# Patient Record
Sex: Male | Born: 1995 | Race: Black or African American | Hispanic: No | Marital: Single | Smoking: Never smoker
Health system: Southern US, Community
[De-identification: ages and names within clinical notes are randomized; demographics above are authoritative.]

## PROBLEM LIST (undated history)

## (undated) DIAGNOSIS — J45909 Unspecified asthma, uncomplicated: Secondary | ICD-10-CM

## (undated) DIAGNOSIS — G43909 Migraine, unspecified, not intractable, without status migrainosus: Secondary | ICD-10-CM

## (undated) HISTORY — PX: WISDOM TOOTH EXTRACTION: SHX21

---
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
## 2015-07-26 ENCOUNTER — Emergency Department (HOSPITAL_COMMUNITY)
Admission: EM | Admit: 2015-07-26 | Discharge: 2015-07-26 | Disposition: A | Discharge status: Home / Self Care | Payer: Medicaid Other | Attending: Emergency Medicine | Admitting: Emergency Medicine

## 2015-07-26 ENCOUNTER — Encounter (HOSPITAL_COMMUNITY): Payer: Self-pay | Admitting: *Deleted

## 2015-07-26 DIAGNOSIS — J309 Allergic rhinitis, unspecified: Secondary | ICD-10-CM | POA: Insufficient documentation

## 2015-07-26 DIAGNOSIS — J029 Acute pharyngitis, unspecified: Secondary | ICD-10-CM | POA: Insufficient documentation

## 2015-07-26 LAB — RAPID STREP SCREEN (MED CTR MEBANE ONLY): Streptococcus, Group A Screen (Direct): NEGATIVE

## 2015-07-26 MED ORDER — DEXAMETHASONE 2 MG PO TABS
10.0000 mg | ORAL_TABLET | Freq: Once | ORAL | Status: DC
  Filled 2015-07-26: qty 5

## 2015-07-26 MED ORDER — CETIRIZINE HCL 10 MG PO TABS: 10.0000 mg | ORAL_TABLET | Freq: Every day | ORAL | 1 refills | Status: AC

## 2015-07-26 MED ORDER — NAPROXEN 250 MG PO TABS: 250.0000 mg | ORAL_TABLET | Freq: Two times a day (BID) | ORAL | 0 refills | Status: AC

## 2015-07-26 MED ORDER — FLUTICASONE PROPIONATE 50 MCG/ACT NA SUSP: 2.0000 | Freq: Every day | NASAL | 0 refills | Status: AC

## 2015-07-26 MED ORDER — ACETAMINOPHEN 325 MG PO TABS
650.0000 mg | ORAL_TABLET | Freq: Once | ORAL | Status: AC
  Administered 2015-07-26: 650 mg via ORAL
  Filled 2015-07-26: qty 2

## 2015-07-26 MED ORDER — DEXAMETHASONE SODIUM PHOSPHATE 10 MG/ML IJ SOLN
10.0000 mg | Freq: Once | INTRAMUSCULAR | Status: AC
  Administered 2015-07-26: 10 mg via INTRAMUSCULAR
  Filled 2015-07-26: qty 1

## 2015-07-26 NOTE — ED Provider Notes (Signed)
[facility]-EMERGENCY DEPT Provider Note   CSN: [id number] Arrival date & time: 07/26/15  1317  First Provider Contact:  3:38 PM    By signing my name below, I, Essence Howell, attest that this documentation has been prepared under the direction and in the presence of Everlene Farrier, PA-C Electronically Signed: Charline Bills, ED Scribe 07/26/2015 at 3:48 PM.   History   Chief Complaint Chief Complaint  Patient presents with  . Sore Throat    HPI JOSPH RIMANDO is a 20 y.o. male.  HPI Comments: VIBHAV CUBIAS is a 20 y.o. male who presents to the Emergency Department complaining of ongoing sore throat for the past 2 weeks. Pt reports increased pain with swallowing. He reports associated pressure behind both ears. No treatments tried PTA. He denies fever, sneezing, trouble swallowing, nasal congestion, cough, neck pain, SOB, abdominal pain, nausea, vomiting, generalized body aches.    The history is provided by the patient. No language interpreter was used.    Past Medical History:  Diagnosis Date  . Asthma   . Migraine     There are no active problems to display for this patient.   History reviewed. No pertinent surgical history.   Home Medications    Prior to Admission medications   Medication Sig Start Date End Date Taking? Authorizing Provider  cetirizine (ZYRTEC ALLERGY) 10 MG tablet Take 1 tablet (10 mg total) by mouth daily. 07/26/15   Everlene Farrier, PA-C  fluticasone (FLONASE) 50 MCG/ACT nasal spray Place 2 sprays into both nostrils daily. 07/26/15   Everlene Farrier, PA-C  HYDROcodone-acetaminophen (NORCO/VICODIN) 5-325 MG per tablet Take 1 tablet by mouth every 4 (four) hours as needed for pain. 03/09/12   Roma Kayser Schorr, NP  ibuprofen (ADVIL,MOTRIN) 800 MG tablet Take 800 mg by mouth every 8 (eight) hours as needed for pain. Reports taking 2 -800mg  ibuprofen an hour apart of each other.    Historical Provider, MD  naproxen (NAPROSYN) 250 MG tablet Take 1  tablet (250 mg total) by mouth 2 (two) times daily with a meal. 07/26/15   Everlene Farrier, PA-C  topiramate (TOPAMAX) 25 MG capsule Take 25 mg by mouth 2 (two) times daily.    Historical Provider, MD    Family History History reviewed. No pertinent family history.  Social History Social History  Substance Use Topics  . Smoking status: Never Smoker  . Smokeless tobacco: Not on file  . Alcohol use No    Allergies   Review of patient's allergies indicates no known allergies.   Review of Systems Review of Systems  Constitutional: Negative for chills, fatigue and fever.  HENT: Positive for congestion and sore throat. Negative for dental problem, drooling, ear discharge, facial swelling, hearing loss, mouth sores, nosebleeds, postnasal drip, sneezing, trouble swallowing and voice change.   Eyes: Negative for pain and visual disturbance.  Respiratory: Negative for cough, shortness of breath and wheezing.   Gastrointestinal: Negative for abdominal pain, nausea and vomiting.  Musculoskeletal: Negative for myalgias and neck pain.  Skin: Negative for rash.    Physical Exam Updated Vital Signs BP 125/63 (BP Location: Right Arm)   Pulse 111   Temp 98.2 F (36.8 C) (Oral)   Resp 16   Ht 6' (1.829 m)   Wt 83.9 kg   SpO2 97%   BMI 25.09 kg/m   Physical Exam  Constitutional: He is oriented to person, place, and time. He appears well-developed and well-nourished. No distress.  Nontoxic appearing.  HENT:  Head:  Normocephalic and atraumatic.  Right Ear: External ear normal.  Left Ear: External ear normal.  Mouth/Throat: Uvula is midline. No trismus in the jaw. No oropharyngeal exudate.  Cerumen in both TMs No ear discharge  Moderate bilateral tonsillar hypertrophy without exudates. Uvula midline without edema. Tongue protrusion is normal. No trismus. No peritonsillar abscess. No drooling.  Speech clear and coherent Boggy nasal turbinates bilaterally.   Eyes: Conjunctivae are  normal. Pupils are equal, round, and reactive to light. Right eye exhibits no discharge. Left eye exhibits no discharge.  Neck: Normal range of motion. Neck supple. No JVD present.  Cardiovascular: Normal rate, regular rhythm, normal heart sounds and intact distal pulses.   HR is 76  Pulmonary/Chest: Effort normal and breath sounds normal. No stridor. No respiratory distress. He has no wheezes. He has no rales.  Lungs clear to auscultation bilaterally.  Abdominal: Soft. There is no tenderness.  Lymphadenopathy:    He has no cervical adenopathy.  Neurological: He is alert and oriented to person, place, and time. Coordination normal.  Skin: Skin is warm and dry. Capillary refill takes less than 2 seconds. No rash noted. He is not diaphoretic. No erythema. No pallor.  Psychiatric: He has a normal mood and affect. His behavior is normal.  Nursing note and vitals reviewed.    ED Treatments / Results  Labs (all labs ordered are listed, but only abnormal results are displayed) Labs Reviewed  RAPID STREP SCREEN (NOT AT [facility])  CULTURE, GROUP A STREP [facility])    EKG  EKG Interpretation None       Radiology No results found.  Procedures Procedures (including critical care time) DIAGNOSTIC STUDIES: Oxygen Saturation is 97% on RA, normal by my interpretation.    COORDINATION OF CARE: 3:42 PM-Discussed treatment plan which includes strep screen, Decadron injection and Tylenol with pt at bedside and pt agreed to plan.   Medications Ordered in ED Medications  dexamethasone (DECADRON) 10 MG/ML injection for Pediatric ORAL use 10 mg (not administered)  acetaminophen (TYLENOL) tablet 650 mg (not administered)    Initial Impression / Assessment and Plan / ED Course  I have reviewed the triage vital signs and the nursing notes.  Pertinent labs & imaging results that were available during my care of the patient were reviewed by me and considered in my medical decision making (see chart for  details).  Clinical Course   This  is a 20 y.o. male who presents to the Emergency Department complaining of ongoing sore throat for the past 2 weeks. Pt reports increased pain with swallowing. He reports associated pressure behind both ears. No treatments tried PTA. He denies fever, sneezing, trouble swallowing, nasal congestion, cough, neck pain, SOB, abdominal pain, nausea, vomiting, generalized body aches.  On exam the patient is afebrile nontoxic appearing. He has mild to moderate bilateral tonsillar hypertrophy. No exudates. Uvula is Without edema. No peritonsillar abscess. No trismus. No drooling. Temperatures and is normal. Speech is clear and coherent. Boggy nasal turbinates bilaterally. Rapid strep is negative. I question if this is somewhat related to allergy symptoms. Patient is provided with oral Decadron and Tylenol. Encouraged him to use naproxen as well as start Zyrtec and Flonase at home. I advised that if his symptoms persist to please follow-up with ENT doctor [facility]. I advised the patient to follow-up with their primary care provider this week. I advised the patient to return to the emergency department with new or worsening symptoms or new concerns. The patient verbalized understanding and agreement  with plan.    I personally performed the services described in this documentation, which was scribed in my presence. The recorded information has been reviewed and is accurate.     Final Clinical Impressions(s) / ED Diagnoses   Final diagnoses:  Sore throat  Allergic rhinitis, unspecified allergic rhinitis type    New Prescriptions New Prescriptions   CETIRIZINE (ZYRTEC ALLERGY) 10 MG TABLET    Take 1 tablet (10 mg total) by mouth daily.   FLUTICASONE (FLONASE) 50 MCG/ACT NASAL SPRAY    Place 2 sprays into both nostrils daily.   NAPROXEN (NAPROSYN) 250 MG TABLET    Take 1 tablet (250 mg total) by mouth 2 (two) times daily with a meal.     Everlene Farrier, PA-C 07/26/15  1556    Laurence Spates, MD 08/06/15 1745

## 2015-07-26 NOTE — ED Triage Notes (Signed)
Pt reports a 2 week HX of sore throat  And a productive cough.

## 2015-07-29 LAB — CULTURE, GROUP A STREP (THRC)

## 2016-02-25 ENCOUNTER — Ambulatory Visit (HOSPITAL_COMMUNITY)
Admission: EM | Admit: 2016-02-25 | Discharge: 2016-02-25 | Disposition: A | Discharge status: Home / Self Care | Payer: BLUE CROSS/BLUE SHIELD | Attending: Family Medicine | Admitting: Family Medicine

## 2016-02-25 ENCOUNTER — Encounter (HOSPITAL_COMMUNITY): Payer: Self-pay | Admitting: Emergency Medicine

## 2016-02-25 DIAGNOSIS — J039 Acute tonsillitis, unspecified: Secondary | ICD-10-CM

## 2016-02-25 MED ORDER — AMOXICILLIN 500 MG PO CAPS: 500.0000 mg | ORAL_CAPSULE | Freq: Three times a day (TID) | ORAL | 0 refills | Status: DC

## 2016-02-25 NOTE — ED Triage Notes (Signed)
Pt started suffering from a sore throat Wednesday night.  Since then he has had N&V, body aches, headache and a fever of 102.

## 2016-02-25 NOTE — ED Provider Notes (Signed)
CSN: [id number]     Arrival date & time 02/25/16  1719 History   None    Chief Complaint  Patient presents with  . Sore Throat   (Consider location/radiation/quality/duration/timing/severity/associated sxs/prior Treatment) The history is provided by the patient. No language interpreter was used.  Sore Throat  This is a new problem. The current episode started more than 2 days ago. The problem occurs constantly. The problem has been gradually worsening. Pertinent negatives include no headaches. Nothing aggravates the symptoms. Nothing relieves the symptoms. He has tried nothing for the symptoms. The treatment provided no relief.    Past Medical History:  Diagnosis Date  . Asthma   . Migraine    History reviewed. No pertinent surgical history. History reviewed. No pertinent family history. Social History  Substance Use Topics  . Smoking status: Never Smoker  . Smokeless tobacco: Never Used  . Alcohol use No    Review of Systems  Neurological: Negative for headaches.  All other systems reviewed and are negative.   Allergies  Patient has no known allergies.  Home Medications   Prior to Admission medications   Medication Sig Start Date End Date Taking? Authorizing Provider  amoxicillin (AMOXIL) 500 MG capsule Take 1 capsule (500 mg total) by mouth 3 (three) times daily. 02/25/16   Elson AreasLeslie K Sofia, PA-C  cetirizine (ZYRTEC ALLERGY) 10 MG tablet Take 1 tablet (10 mg total) by mouth daily. 07/26/15   Everlene FarrierWilliam Dansie, PA-C  fluticasone (FLONASE) 50 MCG/ACT nasal spray Place 2 sprays into both nostrils daily. 07/26/15   Everlene FarrierWilliam Dansie, PA-C  HYDROcodone-acetaminophen (NORCO/VICODIN) 5-325 MG per tablet Take 1 tablet by mouth every 4 (four) hours as needed for pain. 03/09/12   Roma KayserKatherine P Schorr, NP  ibuprofen (ADVIL,MOTRIN) 800 MG tablet Take 800 mg by mouth every 8 (eight) hours as needed for pain. Reports taking 2 -800mg  ibuprofen an hour apart of each other.    Historical Provider, MD   naproxen (NAPROSYN) 250 MG tablet Take 1 tablet (250 mg total) by mouth 2 (two) times daily with a meal. 07/26/15   Everlene FarrierWilliam Dansie, PA-C  topiramate (TOPAMAX) 25 MG capsule Take 25 mg by mouth 2 (two) times daily.    Historical Provider, MD   Meds Ordered and Administered this Visit  Medications - No data to display  BP 120/68 (BP Location: Left Arm)   Pulse 100   Temp 99.8 F (37.7 C) (Oral)   SpO2 100%  No data found.   Physical Exam  Constitutional: He appears well-developed and well-nourished.  HENT:  Mouth/Throat: Oropharyngeal exudate present.  Eyes: Conjunctivae are normal. Pupils are equal, round, and reactive to light.  Neck: Normal range of motion.  Cardiovascular: Normal rate.   Pulmonary/Chest: Effort normal.  Musculoskeletal: Normal range of motion.  Neurological: He is alert.  Skin: Skin is warm.  Psychiatric: He has a normal mood and affect.  Nursing note and vitals reviewed.   Urgent Care Course     Procedures (including critical care time)  Labs Review Labs Reviewed - No data to display  Imaging Review No results found.   Visual Acuity Review  Right Eye Distance:   Left Eye Distance:   Bilateral Distance:    Right Eye Near:   Left Eye Near:    Bilateral Near:         MDM Pt has large swollen tonsils, exudate.  I will treat with amoxicillian, pt advised to return if any problems.   1. Tonsillitis  An After Visit Summary was printed and given to the patient. Meds ordered this encounter  Medications  . amoxicillin (AMOXIL) 500 MG capsule    Sig: Take 1 capsule (500 mg total) by mouth 3 (three) times daily.    Dispense:  30 capsule    Refill:  0    Order Specific Question:   Supervising Provider    Answer:   Bradd Canary D [[id number]]     Lonia Skinner Letcher, PA-C 02/25/16 [phone]

## 2016-02-25 NOTE — Discharge Instructions (Signed)
Return if any problems.

## 2016-02-28 ENCOUNTER — Ambulatory Visit (HOSPITAL_COMMUNITY)
Admission: EM | Admit: 2016-02-28 | Discharge: 2016-02-28 | Disposition: A | Discharge status: Home / Self Care | Payer: BLUE CROSS/BLUE SHIELD | Attending: Family Medicine | Admitting: Family Medicine

## 2016-02-28 ENCOUNTER — Encounter (HOSPITAL_COMMUNITY): Payer: Self-pay | Admitting: Emergency Medicine

## 2016-02-28 DIAGNOSIS — J039 Acute tonsillitis, unspecified: Secondary | ICD-10-CM | POA: Diagnosis not present

## 2016-02-28 DIAGNOSIS — B2790 Infectious mononucleosis, unspecified without complication: Secondary | ICD-10-CM | POA: Diagnosis not present

## 2016-02-28 LAB — POCT INFECTIOUS MONO SCREEN: Mono Screen: POSITIVE — AB

## 2016-02-28 MED ORDER — DEXAMETHASONE SODIUM PHOSPHATE 10 MG/ML IJ SOLN
INTRAMUSCULAR | Status: AC
  Filled 2016-02-28: qty 1

## 2016-02-28 MED ORDER — ONDANSETRON 4 MG PO TBDP
ORAL_TABLET | ORAL | Status: AC
  Filled 2016-02-28: qty 1

## 2016-02-28 MED ORDER — ONDANSETRON HCL 4 MG PO TABS
4.0000 mg | ORAL_TABLET | Freq: Four times a day (QID) | ORAL | 0 refills | Status: AC
Start: 2016-02-28 — End: ?

## 2016-02-28 MED ORDER — ACETAMINOPHEN 325 MG PO TABS
ORAL_TABLET | ORAL | Status: AC
  Filled 2016-02-28: qty 2

## 2016-02-28 MED ORDER — ACETAMINOPHEN 325 MG PO TABS
650.0000 mg | ORAL_TABLET | Freq: Once | ORAL | Status: AC
  Administered 2016-02-28: 650 mg via ORAL

## 2016-02-28 MED ORDER — ONDANSETRON 4 MG PO TBDP
4.0000 mg | ORAL_TABLET | Freq: Once | ORAL | Status: AC
  Administered 2016-02-28: 4 mg via ORAL

## 2016-02-28 MED ORDER — DEXAMETHASONE SODIUM PHOSPHATE 10 MG/ML IJ SOLN
10.0000 mg | Freq: Once | INTRAMUSCULAR | Status: AC
  Administered 2016-02-28: 10 mg via INTRAMUSCULAR

## 2016-02-28 NOTE — Discharge Instructions (Addendum)
You may stop taking the amoxicillin. Take ibuprofen every 6-8 hours as needed and may also take Tylenol every 4 hours. Be sure to drink plenty of fluids and stay well-hydrated. You may feel weak and fatigued for a couple of weeks or longer. Your sore throat should be getting better this week. Cepacol lozenges to help with pain. Read your instructions regarding mononucleosis. No physical activity for 3 weeks. No contact sports for 4 weeks. If you develop abdominal pain or tenderness seek medical attention promptly.

## 2016-02-28 NOTE — ED Provider Notes (Signed)
CSN: [id number]     Arrival date & time 02/28/16  1447 History   First MD Initiated Contact with Patient 02/28/16 1559     Chief Complaint  Patient presents with  . Fever  . Nausea   (Consider location/radiation/quality/duration/timing/severity/associated sxs/prior Treatment) 21 year old male presents with tonsillitis. He was here reported 23rd, 3 days ago and was diagnosed with exudative tonsillitis and treated with amoxicillin 3 times a day advised to take ibuprofen and Tylenol. He has been taken ibuprofen but not Tylenol in the last dose was 10 AM. He states he came back because the fever is not any better , it is remaining high and his throat still hurts. He is also had nausea and some vomiting. He has vomited once today. He is able to drink water but not able to eat. No trouble with breathing. Compliant with antibiotics.      Past Medical History:  Diagnosis Date  . Asthma   . Migraine    History reviewed. No pertinent surgical history. History reviewed. No pertinent family history. Social History  Substance Use Topics  . Smoking status: Never Smoker  . Smokeless tobacco: Never Used  . Alcohol use No    Review of Systems  Constitutional: Positive for activity change, appetite change, fatigue and fever.  HENT: Positive for sore throat. Negative for congestion and ear pain.   Eyes: Negative.   Respiratory: Negative.   Cardiovascular: Negative.   Gastrointestinal: Positive for nausea and vomiting. Negative for abdominal pain.  Genitourinary: Negative.   Musculoskeletal: Negative.   Neurological: Positive for headaches.  All other systems reviewed and are negative.   Allergies  Patient has no known allergies.  Home Medications   Prior to Admission medications   Medication Sig Start Date End Date Taking? Authorizing Provider  cetirizine (ZYRTEC ALLERGY) 10 MG tablet Take 1 tablet (10 mg total) by mouth daily. 07/26/15   Everlene FarrierWilliam Dansie, PA-C  fluticasone (FLONASE) 50  MCG/ACT nasal spray Place 2 sprays into both nostrils daily. 07/26/15   Everlene FarrierWilliam Dansie, PA-C  HYDROcodone-acetaminophen (NORCO/VICODIN) 5-325 MG per tablet Take 1 tablet by mouth every 4 (four) hours as needed for pain. 03/09/12   Roma KayserKatherine P Schorr, NP  ibuprofen (ADVIL,MOTRIN) 800 MG tablet Take 800 mg by mouth every 8 (eight) hours as needed for pain. Reports taking 2 -800mg  ibuprofen an hour apart of each other.    Historical Provider, MD  naproxen (NAPROSYN) 250 MG tablet Take 1 tablet (250 mg total) by mouth 2 (two) times daily with a meal. 07/26/15   Everlene FarrierWilliam Dansie, PA-C  ondansetron (ZOFRAN) 4 MG tablet Take 1 tablet (4 mg total) by mouth every 6 (six) hours. 02/28/16   Hayden Rasmussenavid Alaria Oconnor, NP  topiramate (TOPAMAX) 25 MG capsule Take 25 mg by mouth 2 (two) times daily.    Historical Provider, MD   Meds Ordered and Administered this Visit   Medications  acetaminophen (TYLENOL) tablet 650 mg (650 mg Oral Given 02/28/16 1544)  dexamethasone (DECADRON) injection 10 mg (10 mg Intramuscular Given 02/28/16 1618)  ondansetron (ZOFRAN-ODT) disintegrating tablet 4 mg (4 mg Oral Given 02/28/16 1617)    BP 111/74 (BP Location: Right Arm)   Pulse 111   Temp 102.9 F (39.4 C) (Oral)   SpO2 100%  No data found.   Physical Exam  Constitutional: He is oriented to person, place, and time. He appears well-developed and well-nourished. No distress.  HENT:  Head: Normocephalic and atraumatic.  Right Ear: External ear normal.  Left Ear: External ear  normal.  Mouth/Throat: Oropharyngeal exudate present.  Bilateral TMs are normal. Oropharynx with bilateral enlarged erythematous muscles covered with gray exudates. Posterior pharynx with erythema. Airway widely patent. No evidence of abscess formation.  Eyes: EOM are normal.  Neck: Normal range of motion. Neck supple.  Solitary left anterior cervical node. Posterior cervical nodes palpated.  Cardiovascular: Normal rate, regular rhythm, normal heart sounds and intact  distal pulses.   Pulmonary/Chest: Effort normal and breath sounds normal. No respiratory distress. He has no wheezes.  Musculoskeletal: Normal range of motion. He exhibits no edema.  Neurological: He is alert and oriented to person, place, and time.  Skin: Skin is warm and dry.  Psychiatric: He has a normal mood and affect.  Nursing note and vitals reviewed.   Urgent Care Course     Procedures (including critical care time)  Labs Review Labs Reviewed  POCT INFECTIOUS MONO SCREEN - Abnormal; Notable for the following:       Result Value   Mono Screen POSITIVE (*)    All other components within normal limits    Imaging Review No results found.   Visual Acuity Review  Right Eye Distance:   Left Eye Distance:   Bilateral Distance:    Right Eye Near:   Left Eye Near:    Bilateral Near:         MDM   1. Mononucleosis   2. Exudative tonsillitis    You may stop taking the amoxicillin. Take ibuprofen every 6-8 hours as needed and may also take Tylenol every 4 hours. Be sure to drink plenty of fluids and stay well-hydrated. You may feel weak and fatigued for a couple of weeks or longer. Your sore throat should be getting better this week. Cepacol lozenges to help with pain. Read your instructions regarding mononucleosis. No physical activity for 3 weeks. No contact sports for 4 weeks. If you develop abdominal pain or tenderness seek medical attention promptly. Meds ordered this encounter  Medications  . acetaminophen (TYLENOL) tablet 650 mg  . dexamethasone (DECADRON) injection 10 mg  . ondansetron (ZOFRAN-ODT) disintegrating tablet 4 mg  . ondansetron (ZOFRAN) 4 MG tablet    Sig: Take 1 tablet (4 mg total) by mouth every 6 (six) hours.    Dispense:  12 tablet    Refill:  0    Order Specific Question:   Supervising Provider    Answer:   Linna Hoff [[id number]]       Hayden Rasmussen, NP 02/28/16 1704

## 2016-02-28 NOTE — ED Triage Notes (Signed)
Pt was seen here and treated two days ago for tonsillitis.  Pt has been taking his Amoxicillin as prescribed.  Pt is here today for increasing nausea and fever.  Pt presents today febrile 102.9.

## 2017-12-15 ENCOUNTER — Encounter (HOSPITAL_COMMUNITY): Payer: Self-pay

## 2017-12-15 ENCOUNTER — Emergency Department (HOSPITAL_COMMUNITY)
Admission: EM | Admit: 2017-12-15 | Discharge: 2017-12-15 | Disposition: A | Discharge status: Home / Self Care | Payer: Managed Care, Other (non HMO) | Attending: Emergency Medicine | Admitting: Emergency Medicine

## 2017-12-15 ENCOUNTER — Other Ambulatory Visit: Payer: Self-pay

## 2017-12-15 DIAGNOSIS — J45909 Unspecified asthma, uncomplicated: Secondary | ICD-10-CM | POA: Insufficient documentation

## 2017-12-15 DIAGNOSIS — S0993XA Unspecified injury of face, initial encounter: Secondary | ICD-10-CM | POA: Diagnosis present

## 2017-12-15 DIAGNOSIS — S0181XA Laceration without foreign body of other part of head, initial encounter: Secondary | ICD-10-CM

## 2017-12-15 DIAGNOSIS — Z79899 Other long term (current) drug therapy: Secondary | ICD-10-CM | POA: Diagnosis not present

## 2017-12-15 DIAGNOSIS — Y999 Unspecified external cause status: Secondary | ICD-10-CM | POA: Insufficient documentation

## 2017-12-15 DIAGNOSIS — Y9367 Activity, basketball: Secondary | ICD-10-CM | POA: Diagnosis not present

## 2017-12-15 DIAGNOSIS — W51XXXA Accidental striking against or bumped into by another person, initial encounter: Secondary | ICD-10-CM | POA: Diagnosis not present

## 2017-12-15 DIAGNOSIS — Y929 Unspecified place or not applicable: Secondary | ICD-10-CM | POA: Diagnosis not present

## 2017-12-15 MED ORDER — LIDOCAINE HCL 1 % IJ SOLN
INTRAMUSCULAR | Status: AC
  Administered 2017-12-15: 20 mL
  Filled 2017-12-15: qty 20

## 2017-12-15 NOTE — ED Triage Notes (Signed)
patient reports that he was playing basketball and another player fell on him and he hit his chin on the floor which caused the laceration. Bleeding controlled.

## 2017-12-15 NOTE — Discharge Instructions (Addendum)
Your sutures will need to be taken out in 5 to 7 days. You can go to your doctor, the Urgent Care or return here. Return sooner for any problems.

## 2017-12-15 NOTE — ED Provider Notes (Signed)
[facility]-EMERGENCY DEPT Provider Note   CSN: [id number] Arrival date & time: 12/15/17  1755     History   Chief Complaint Chief Complaint  Patient presents with  . chin laceration    HPI Jacob Carey is a 22 y.o. male who presents to the ED for a laceration to the chin. Patient reports he was playing basketball and there was a loose ball. Patient went for the ball and fell and another player fell on the patient. Patient's chin hit and cut it. Up to date on immunizations.   HPI  Past Medical History:  Diagnosis Date  . Asthma   . Migraine     There are no active problems to display for this patient.   Past Surgical History:  Procedure Laterality Date  . WISDOM TOOTH EXTRACTION          Home Medications    Prior to Admission medications   Medication Sig Start Date End Date Taking? Authorizing Provider  cetirizine (ZYRTEC ALLERGY) 10 MG tablet Take 1 tablet (10 mg total) by mouth daily. 07/26/15   Everlene Farrier, PA-C  fluticasone (FLONASE) 50 MCG/ACT nasal spray Place 2 sprays into both nostrils daily. 07/26/15   Everlene Farrier, PA-C  HYDROcodone-acetaminophen (NORCO/VICODIN) 5-325 MG per tablet Take 1 tablet by mouth every 4 (four) hours as needed for pain. 03/09/12   Schorr, Roma Kayser, NP  ibuprofen (ADVIL,MOTRIN) 800 MG tablet Take 800 mg by mouth every 8 (eight) hours as needed for pain. Reports taking 2 -800mg  ibuprofen an hour apart of each other.    [provider]  naproxen (NAPROSYN) 250 MG tablet Take 1 tablet (250 mg total) by mouth 2 (two) times daily with a meal. 07/26/15   Everlene Farrier, PA-C  ondansetron (ZOFRAN) 4 MG tablet Take 1 tablet (4 mg total) by mouth every 6 (six) hours. 02/28/16   Hayden Rasmussen, NP  topiramate (TOPAMAX) 25 MG capsule Take 25 mg by mouth 2 (two) times daily.    [provider]    Family History History reviewed. No pertinent family history.  Social History Social History    Tobacco Use  . Smoking status: Never Smoker  . Smokeless tobacco: Never Used  Substance Use Topics  . Alcohol use: No  . Drug use: No     Allergies   Patient has no known allergies.   Review of Systems Review of Systems  Skin: Positive for wound.  All other systems reviewed and are negative.    Physical Exam Updated Vital Signs BP (!) 141/74 (BP Location: Left Arm)   Pulse 65   Temp (!) 97.5 F (36.4 C) (Oral)   Resp 18   Ht 6' 0.5" (1.842 m)   Wt 86.2 kg   SpO2 98%   BMI 25.41 kg/m   Physical Exam Vitals signs and nursing note reviewed.  Constitutional:      Appearance: He is well-developed.  HENT:     Head: Laceration present.      Right Ear: Tympanic membrane normal.     Left Ear: Tympanic membrane normal.     Nose: Nose normal.     Mouth/Throat:     Lips: No lesions.     Mouth: Mucous membranes are moist.     Dentition: Normal dentition.     Comments: Patient able to open and close his mouth without TMJ pain and no click. Tender over the chin where the laceration is.  Eyes:     Extraocular Movements:  Extraocular movements intact.     Conjunctiva/sclera: Conjunctivae normal.     Pupils: Pupils are equal, round, and reactive to light.  Neck:     Musculoskeletal: Normal range of motion and neck supple. No muscular tenderness.  Cardiovascular:     Rate and Rhythm: Normal rate.  Pulmonary:     Effort: Pulmonary effort is normal.  Abdominal:     Palpations: Abdomen is soft.     Tenderness: There is no abdominal tenderness.  Musculoskeletal: Normal range of motion.  Skin:    General: Skin is warm and dry.  Neurological:     Mental Status: He is alert and oriented to person, place, and time.     Gait: Gait normal.  Psychiatric:        Mood and Affect: Mood normal.      ED Treatments / Results  Labs (all labs ordered are listed, but only abnormal results are displayed) Labs Reviewed - No data to display  Radiology No results  found.  Procedures .Marland Kitchen.Laceration Repair Date/Time: 12/15/2017 8:15 PM Performed by: Janne NapoleonNeese, Jessenia Filippone M, NP Authorized by: Janne NapoleonNeese, Amariona Rathje M, NP   Consent:    Consent obtained:  Verbal   Consent given by:  Patient   Risks discussed:  Pain and poor cosmetic result Anesthesia (see MAR for exact dosages):    Anesthesia method:  Local infiltration   Local anesthetic:  Lidocaine 1% w/o epi Laceration details:    Location:  Face   Face location:  Chin   Length (cm):  3 Repair type:    Repair type:  Simple Pre-procedure details:    Preparation:  Patient was prepped and draped in usual sterile fashion Exploration:    Hemostasis achieved with:  Direct pressure   Contaminated: no   Treatment:    Area cleansed with:  Saline   Amount of cleaning:  Standard   Irrigation solution:  Sterile saline   Irrigation method:  Syringe Skin repair:    Repair method:  Sutures   Suture size:  6-0   Suture material:  Prolene   Suture technique:  Simple interrupted   Number of sutures:  6 Approximation:    Approximation:  Close Post-procedure details:    Dressing:  Antibiotic ointment   Patient tolerance of procedure:  Tolerated well, no immediate complications   (including critical care time)  Medications Ordered in ED Medications  lidocaine (XYLOCAINE) 1 % (with pres) injection (20 mLs  Given by Other 12/15/17 1937)     Initial Impression / Assessment and Plan / ED Course  I have reviewed the triage vital signs and the nursing notes. 22 y.o. male here with a laceration to his chin due to injury while playing basketball stable for d/c after wound closed. Discussed with the patient f/u in 5 to 7 days for suture removal or sooner if any signs of infection.  Final Clinical Impressions(s) / ED Diagnoses   Final diagnoses:  Laceration of skin of chin, initial encounter    ED Discharge Orders    None       Kerrie Buffaloeese, Hays Dunnigan HostetterM, [place]NP 12/15/17 2229    Linwood DibblesKnapp, Jon, MD 12/15/17 2348

## 2017-12-21 ENCOUNTER — Ambulatory Visit (HOSPITAL_COMMUNITY)
Admission: EM | Admit: 2017-12-21 | Discharge: 2017-12-21 | Disposition: A | Discharge status: Home / Self Care | Payer: Managed Care, Other (non HMO)

## 2017-12-21 ENCOUNTER — Encounter (HOSPITAL_COMMUNITY): Payer: Self-pay | Admitting: Emergency Medicine

## 2017-12-21 DIAGNOSIS — S0181XA Laceration without foreign body of other part of head, initial encounter: Secondary | ICD-10-CM | POA: Diagnosis not present

## 2017-12-21 DIAGNOSIS — Z4802 Encounter for removal of sutures: Secondary | ICD-10-CM

## 2017-12-21 NOTE — ED Triage Notes (Signed)
Pt here for suture removal; pt had 7 suture removed; wound has abrasion to left side; PA made aware
